# Patient Record
Sex: Female | Born: 1970 | Race: White | Hispanic: No | Marital: Single | Smoking: Never smoker
Health system: Southern US, Community
[De-identification: ages and names within clinical notes are randomized; demographics above are authoritative.]

## PROBLEM LIST (undated history)

## (undated) DIAGNOSIS — I10 Essential (primary) hypertension: Secondary | ICD-10-CM

## (undated) HISTORY — PX: HERNIA REPAIR: SHX51

## (undated) HISTORY — PX: CERVICAL SPINE SURGERY: SHX589

## (undated) HISTORY — PX: ABDOMINAL HYSTERECTOMY: SHX81

---

## 2010-05-07 ENCOUNTER — Ambulatory Visit: Payer: Self-pay | Admitting: Family Medicine

## 2010-05-17 ENCOUNTER — Ambulatory Visit: Payer: Self-pay | Admitting: Family Medicine

## 2015-03-27 ENCOUNTER — Encounter: Payer: Self-pay | Admitting: Emergency Medicine

## 2015-03-27 ENCOUNTER — Ambulatory Visit
Admission: EM | Admit: 2015-03-27 | Discharge: 2015-03-27 | Disposition: A | Discharge status: Home / Self Care | Payer: BLUE CROSS/BLUE SHIELD | Attending: Family Medicine | Admitting: Family Medicine

## 2015-03-27 DIAGNOSIS — M62838 Other muscle spasm: Secondary | ICD-10-CM

## 2015-03-27 DIAGNOSIS — J302 Other seasonal allergic rhinitis: Secondary | ICD-10-CM

## 2015-03-27 DIAGNOSIS — M6249 Contracture of muscle, multiple sites: Secondary | ICD-10-CM | POA: Diagnosis not present

## 2015-03-27 DIAGNOSIS — J0100 Acute maxillary sinusitis, unspecified: Secondary | ICD-10-CM | POA: Diagnosis not present

## 2015-03-27 HISTORY — DX: Essential (primary) hypertension: I10

## 2015-03-27 MED ORDER — AMOXICILLIN-POT CLAVULANATE 875-125 MG PO TABS: 1.0000 | ORAL_TABLET | Freq: Two times a day (BID) | ORAL | Status: DC

## 2015-03-27 MED ORDER — METAXALONE 800 MG PO TABS: 800.0000 mg | ORAL_TABLET | Freq: Three times a day (TID) | ORAL | Status: DC

## 2015-03-27 NOTE — ED Provider Notes (Signed)
CSN: [id number]     Arrival date & time 03/27/15  1506 History   First MD Initiated Contact with Patient 03/27/15 1619     Chief Complaint  Patient presents with  . Facial Pain  . Headache   (Consider location/radiation/quality/duration/timing/severity/associated sxs/prior Treatment) HPI   This a 44 year old female who has recently moved into our area from [place] presents now with headaches and neck aches and sinus pain which she feels mostly over her maxillary sinuses. In addition, she is complaining of some neck aching with any motion; relates that she has been using a computer not on her desk with her extreme neck flexion may be the cause. She also is not using her pillow that she normally does and that she is now temporarily here -not on her normal bed. Denies any fever or chills. She has an appointment with Dr. Gavin Potters on Tuesday but came here because she was just miserable with her sinuses and her neck. Neck motion will occasionally give her a momentary stabbing pains in different parts of her head that do not persist.         Past Medical History  Diagnosis Date  . Hypertension    Past Surgical History  Procedure Laterality Date  . Hernia repair    . Abdominal hysterectomy     History reviewed. No pertinent family history. Social History  Substance Use Topics  . Smoking status: Never Smoker   . Smokeless tobacco: None  . Alcohol Use: Yes   OB History    No data available     Review of Systems  Constitutional: Negative for fever, chills and fatigue.  HENT: Positive for postnasal drip, sinus pressure and sore throat.   Musculoskeletal: Positive for myalgias, arthralgias and neck pain.  Allergic/Immunologic: Positive for environmental allergies.  All other systems reviewed and are negative.   Allergies  Erythromycin and Tape  Home Medications   Prior to Admission medications   Medication Sig Start Date End Date Taking? Authorizing Provider   hydrochlorothiazide (MICROZIDE) 12.5 MG capsule Take 12.5 mg by mouth daily.   Yes Historical Provider, MD  meloxicam (MOBIC) 15 MG tablet Take 15 mg by mouth daily.   Yes Historical Provider, MD  amoxicillin-clavulanate (AUGMENTIN) 875-125 MG per tablet Take 1 tablet by mouth every 12 (twelve) hours. 03/27/15   Lutricia Feil, PA-C  metaxalone (SKELAXIN) 800 MG tablet Take 1 tablet (800 mg total) by mouth 3 (three) times daily. 03/27/15   Lutricia Feil, PA-C   Meds Ordered and Administered this Visit  Medications - No data to display  BP 155/90 mmHg  Pulse 78  Temp(Src) 97 F (36.1 C) (Tympanic)  Resp 16  Ht  (1.626 m)  Wt 118 lb (53.524 kg)  BMI 20.24 kg/m2  SpO2 99% No data found.   Physical Exam  Constitutional: She is oriented to person, place, and time. She appears well-developed and well-nourished.  HENT:  Head: Normocephalic and atraumatic.  Right Ear: External ear normal.  Left Ear: External ear normal.  Tenderness percussion over the sinuses both the maxillary and frontal with the maxillary be more tender. Oropharynx is benign  Neck: No thyromegaly present.  Examination cervical spine shows tenderness to palpation of the posterior cervical muscles. Range of motion is slightly decreased with the discomfort at the extreme of all motion.  Pulmonary/Chest: Effort normal and breath sounds normal. No respiratory distress. She has no wheezes. She has no rales.  Musculoskeletal: Normal range of motion.  Lymphadenopathy:  She has no cervical adenopathy.  Neurological: She is alert and oriented to person, place, and time.  Skin: Skin is warm and dry.  Psychiatric: Her behavior is normal. Judgment and thought content normal.  Nursing note and vitals reviewed.   ED Course  Procedures (including critical care time)  Labs Review Labs Reviewed - No data to display  Imaging Review No results found.   Visual Acuity Review  Right Eye Distance:   Left Eye  Distance:   Bilateral Distance:    Right Eye Near:   Left Eye Near:    Bilateral Near:         MDM   1. Acute maxillary sinusitis, recurrence not specified   2. Seasonal allergies   3. Cervical paraspinal muscle spasm    Discharge Medication List as of 03/27/2015  4:49 PM    START taking these medications   Details  amoxicillin-clavulanate (AUGMENTIN) 875-125 MG per tablet Take 1 tablet by mouth every 12 (twelve) hours., Starting 03/27/2015, Until Discontinued, Print    metaxalone (SKELAXIN) 800 MG tablet Take 1 tablet (800 mg total) by mouth 3 (three) times daily., Starting 03/27/2015, Until Discontinued, Print      Plan: 1. Diagnosis reviewed with patient 2. rx as per orders; risks, benefits, potential side effects reviewed with patient 3. Recommend supportive treatment with computer ergonomics,pillow change.Flonaze OTC during Fall season 4. F/u prn if symptoms worsen or don't improve     Lutricia Feil, PA-C 03/27/15 1700

## 2015-03-27 NOTE — Discharge Instructions (Signed)

## 2015-03-27 NOTE — ED Notes (Signed)
Patient c/o HAs, sinus pain and pressure for a week.  Patient denies fevers.

## 2016-04-12 ENCOUNTER — Other Ambulatory Visit: Payer: Self-pay | Admitting: Nurse Practitioner

## 2016-04-12 DIAGNOSIS — Z1231 Encounter for screening mammogram for malignant neoplasm of breast: Secondary | ICD-10-CM

## 2016-06-26 ENCOUNTER — Ambulatory Visit
Admission: EM | Admit: 2016-06-26 | Discharge: 2016-06-26 | Disposition: A | Discharge status: Home / Self Care | Payer: BLUE CROSS/BLUE SHIELD | Attending: Family Medicine | Admitting: Family Medicine

## 2016-06-26 DIAGNOSIS — N3001 Acute cystitis with hematuria: Secondary | ICD-10-CM

## 2016-06-26 LAB — URINALYSIS, COMPLETE (UACMP) WITH MICROSCOPIC

## 2016-06-26 MED ORDER — CIPROFLOXACIN HCL 500 MG PO TABS: 500.0000 mg | ORAL_TABLET | Freq: Two times a day (BID) | ORAL | 0 refills | Status: DC

## 2016-06-26 NOTE — ED Triage Notes (Signed)
Pt c/o urinary frequency, urgency, and bladder spasms.

## 2016-06-26 NOTE — Discharge Instructions (Signed)
Take the antibiotic twice daily as prescribed.  Please follow-up if he worsen or fail to improve.  Take care  Dr.Kortlynn Poust

## 2016-06-26 NOTE — ED Provider Notes (Signed)
[facility]    CSN: [id number] Arrival date & time: 06/26/16  [id number]  History   Chief Complaint Chief Complaint  Patient presents with  . Urinary Tract Infection   HPI  45 year old female presents with concerns for UTI.  Patient states that she's had urinary frequency and dysuria since Tuesday. She's had associated "bladder spasms". She reports associated low back pain. No flank pain. No fever or chills. She's taking Uristat for her symptoms. No known exacerbating factors. She feels like this may be associated with intercourse. No other complaints or concerns at this time.  Past Medical History:  Diagnosis Date  . Hypertension    Past Surgical History:  Procedure Laterality Date  . ABDOMINAL HYSTERECTOMY    . HERNIA REPAIR     OB History    No data available     Home Medications    Prior to Admission medications   Medication Sig Start Date End Date Taking? Authorizing Provider  hydrochlorothiazide (MICROZIDE) 12.5 MG capsule Take 12.5 mg by mouth daily.   Yes Historical Provider, MD  meloxicam (MOBIC) 15 MG tablet Take 15 mg by mouth daily.   Yes Historical Provider, MD  ciprofloxacin (CIPRO) 500 MG tablet Take 1 tablet (500 mg total) by mouth every 12 (twelve) hours. 06/26/16   Tommie SamsJayce G Samuell Knoble, DO  metaxalone (SKELAXIN) 800 MG tablet Take 1 tablet (800 mg total) by mouth 3 (three) times daily. 03/27/15   Lutricia FeilWilliam P Roemer, PA-C   Family History History reviewed. No pertinent family history.  Social History Social History  Substance Use Topics  . Smoking status: Never Smoker  . Smokeless tobacco: Never Used  . Alcohol use Yes   Allergies   Erythromycin and Tape  Review of Systems Review of Systems  Constitutional: Negative.   Genitourinary: Positive for dysuria and frequency.  Musculoskeletal: Positive for back pain.   Physical Exam Triage Vital Signs ED Triage Vitals  Enc Vitals Group     BP 06/26/16 1039 (!) 173/101     Pulse Rate 06/26/16 1039  93     Resp 06/26/16 1039 18     Temp 06/26/16 1039 99.9 F (37.7 C)     Temp Source 06/26/16 1039 Oral     SpO2 06/26/16 1039 98 %     Weight 06/26/16 1041 112 lb (50.8 kg)     Height 06/26/16 1041 5\' 4"  (1.626 m)     Head Circumference --      Peak Flow --      Pain Score 06/26/16 1041 9     Pain Loc --      Pain Edu? --      Excl. in GC? --    Updated Vital Signs BP (!) 173/101 (BP Location: Right Arm)   Pulse 93   Temp 99.9 F (37.7 C) (Oral)   Resp 18   Ht 5\' 4"  (1.626 m)   Wt 112 lb (50.8 kg)   SpO2 98%   BMI 19.22 kg/m   Physical Exam  Constitutional: She is oriented to person, place, and time. She appears well-developed. No distress.  Cardiovascular: Normal rate and regular rhythm.   Pulmonary/Chest: Effort normal and breath sounds normal.  Abdominal: Soft.  Tender to palpation in the suprapubic region. No CVA tenderness.  Neurological: She is alert and oriented to person, place, and time.  Psychiatric:  Flat affect.   UC Treatments / Results  Labs (all labs ordered are listed, but only abnormal results are displayed) Labs Reviewed  URINALYSIS, COMPLETE (UACMP) WITH MICROSCOPIC - Abnormal; Notable for the following:       Result Value   Color, Urine ORANGE (*)    APPearance CLOUDY (*)    Glucose, UA   (*)    Value: TEST NOT REPORTED DUE TO COLOR INTERFERENCE OF URINE PIGMENT   Hgb urine dipstick   (*)    Value: TEST NOT REPORTED DUE TO COLOR INTERFERENCE OF URINE PIGMENT   Bilirubin Urine   (*)    Value: TEST NOT REPORTED DUE TO COLOR INTERFERENCE OF URINE PIGMENT   Ketones, ur   (*)    Value: TEST NOT REPORTED DUE TO COLOR INTERFERENCE OF URINE PIGMENT   Protein, ur   (*)    Value: TEST NOT REPORTED DUE TO COLOR INTERFERENCE OF URINE PIGMENT   Nitrite   (*)    Value: TEST NOT REPORTED DUE TO COLOR INTERFERENCE OF URINE PIGMENT   Leukocytes, UA   (*)    Value: TEST NOT REPORTED DUE TO COLOR INTERFERENCE OF URINE PIGMENT   Squamous Epithelial / LPF  0-5 (*)    Bacteria, UA MANY (*)    All other components within normal limits  URINE CULTURE    EKG  EKG Interpretation None      Radiology No results found.  Procedures Procedures (including critical care time)  Medications Ordered in UC Medications - No data to display   Initial Impression / Assessment and Plan / UC Course  I have reviewed the triage vital signs and the nursing notes.  Pertinent labs & imaging results that were available during my care of the patient were reviewed by me and considered in my medical decision making (see chart for details).  Clinical Course   45 year old female presents with symptoms of UTI. Patient has classic symptoms. Bacteria on UA. Remainder of UA unreadable secondary to OTC medication. Treating empirically with Cipro while awaiting culture.  Final Clinical Impressions(s) / UC Diagnoses   Final diagnoses:  Acute cystitis with hematuria    New Prescriptions New Prescriptions   CIPROFLOXACIN (CIPRO) 500 MG TABLET    Take 1 tablet (500 mg total) by mouth every 12 (twelve) hours.     Tommie SamsJayce G Santo Zahradnik, DO 06/26/16 1132

## 2016-06-29 ENCOUNTER — Telehealth: Payer: Self-pay | Admitting: Emergency Medicine

## 2016-06-29 LAB — URINE CULTURE: Culture: >=100000 — AB

## 2016-06-29 MED ORDER — NITROFURANTOIN MONOHYD MACRO 100 MG PO CAPS: 100.0000 mg | ORAL_CAPSULE | Freq: Two times a day (BID) | ORAL | 0 refills | Status: AC

## 2016-06-29 NOTE — Telephone Encounter (Signed)
Patient notified of urine culture result. Patient states that her urinary symptoms have not improved. Result show resistance to the Cipro which she is currently on.  Dr. Chaney MallingMortenson reviewed patient's results and would like Macrobid 100mg  1 tablet twice a day for 5 days be sent to patient's pharmacy.  Patient was instructed to stop the Cipro and that a prescription for Macrobid was sent to the [facility] in [place].  Patient instructed to take the new antibiotic as directed and to follow-up here or with PCP if symptoms do not improve or worsen.  Patient verbalized understanding.

## 2016-07-19 ENCOUNTER — Encounter: Payer: Self-pay | Admitting: Emergency Medicine

## 2016-07-19 ENCOUNTER — Ambulatory Visit
Admission: EM | Admit: 2016-07-19 | Discharge: 2016-07-19 | Disposition: A | Discharge status: Home / Self Care | Payer: BLUE CROSS/BLUE SHIELD | Attending: Family Medicine | Admitting: Family Medicine

## 2016-07-19 DIAGNOSIS — N390 Urinary tract infection, site not specified: Secondary | ICD-10-CM | POA: Diagnosis not present

## 2016-07-19 LAB — URINALYSIS, COMPLETE (UACMP) WITH MICROSCOPIC
Bilirubin Urine: NEGATIVE
GLUCOSE, UA: 100 mg/dL — AB
Ketones, ur: NEGATIVE mg/dL
NITRITE: POSITIVE — AB
PROTEIN: 30 mg/dL — AB
SPECIFIC GRAVITY, URINE: 1.015 (ref 1.005–1.030)
pH: 7 (ref 5.0–8.0)

## 2016-07-19 LAB — GLUCOSE, CAPILLARY
GLUCOSE-CAPILLARY: 115 mg/dL — AB (ref 65–99)
Glucose-Capillary: 115 mg/dL — ABNORMAL HIGH (ref 65–99)

## 2016-07-19 MED ORDER — CEPHALEXIN 500 MG PO CAPS: 500.0000 mg | ORAL_CAPSULE | Freq: Two times a day (BID) | ORAL | 0 refills | Status: AC

## 2016-07-19 NOTE — ED Provider Notes (Signed)
[facility] ____________________________________________  Time seen: Approximately 10:31 AM  I have reviewed the triage vital signs and the nursing notes.   HISTORY  Chief Complaint Dysuria   HPI Anita Pratt is a 46 y.o. female presenting for the complaints of urinary frequency, urinary urgency and burning with urination for the last 3 days. Patient reports has a history of similar occurring with urinary tract infection. Reports last urinary tract infection was 3 weeks ago, treated with oral Macrobid. Patient reports she was initially treated with Cipro however her urine showed resistance to Cipro. Patient reports she previously had had last urinary tract infection approximately 1.5 years ago, but reports she is now sexually active and urinary tract infections are starting to become more frequent. Patient reports that she does void post sexual intercourse. Denies any other known triggers. Reports continues to eat and drink well. Denies fevers, abdominal pain, back pain, vaginal discharge, vaginal complaints, rash, other recent antibiotic use. Denies other complaints.  No LMP recorded. Patient has had a hysterectomy.   Past Medical History:  Diagnosis Date  . Hypertension     There are no active problems to display for this patient.   Past Surgical History:  Procedure Laterality Date  . ABDOMINAL HYSTERECTOMY    . HERNIA REPAIR     No current facility-administered medications for this encounter.   Current Outpatient Prescriptions:  .  cephALEXin (KEFLEX) 500 MG capsule, Take 1 capsule (500 mg total) by mouth 2 (two) times daily., Disp: 14 capsule, Rfl: 0 .  hydrochlorothiazide (MICROZIDE) 12.5 MG capsule, Take 12.5 mg by mouth daily., Disp: , Rfl:  .  meloxicam (MOBIC) 15 MG tablet, Take 15 mg by mouth daily., Disp: , Rfl:   Allergies Erythromycin and Tape  History reviewed. No pertinent family history.  Social History Social History  Substance Use  Topics  . Smoking status: Never Smoker  . Smokeless tobacco: Never Used  . Alcohol use Yes    Review of Systems Constitutional: No fever/chills Eyes: No visual changes. ENT: No sore throat. Cardiovascular: Denies chest pain. Respiratory: Denies shortness of breath. Gastrointestinal: No abdominal pain.  No nausea, no vomiting.  No diarrhea.  No constipation. Genitourinary: Positive for dysuria. Musculoskeletal: Negative for back pain. Skin: Negative for rash. Neurological: Negative for headaches, focal weakness or numbness.  10-point ROS otherwise negative.  ____________________________________________   PHYSICAL EXAM:  VITAL SIGNS: ED Triage Vitals  Enc Vitals Group     BP 07/19/16 0958 (!) 165/97     Pulse Rate 07/19/16 0958 82     Resp 07/19/16 0958 16     Temp 07/19/16 0958 97.8 F (36.6 C)     Temp Source 07/19/16 0958 Oral     SpO2 07/19/16 0958 99 %     Weight 07/19/16 0958 112 lb (50.8 kg)     Height 07/19/16 0958 5\' 4"  (1.626 m)     Head Circumference --      Peak Flow --      Pain Score 07/19/16 1000 3     Pain Loc --      Pain Edu? --      Excl. in GC? --     Constitutional: Alert and oriented. Well appearing and in no acute distress. Eyes: Conjunctivae are normal. PERRL. EOMI. ENT      Head: Normocephalic and atraumatic.      Mouth/Throat: Mucous membranes are moist.Oropharynx non-erythematous. Cardiovascular: Normal rate, regular rhythm. Grossly normal heart sounds.  Good peripheral circulation. Respiratory:  Normal respiratory effort without tachypnea nor retractions. Breath sounds are clear and equal bilaterally. No wheezes/rales/rhonchi.. Gastrointestinal: Soft and nontender. No distention. No CVA tenderness. Musculoskeletal: Ambulatory with steady gait. No midline cervical, thoracic or lumbar tenderness to palpation.  Skin:  Skin is warm, dry and intact. No rash noted. Psychiatric: Mood and affect are normal. Speech and behavior are normal.  Patient exhibits appropriate insight and judgment   ___________________________________________   LABS (all labs ordered are listed, but only abnormal results are displayed)  Labs Reviewed  URINALYSIS, COMPLETE (UACMP) WITH MICROSCOPIC - Abnormal; Notable for the following:       Result Value   Color, Urine AMBER (*)    APPearance CLOUDY (*)    Glucose, UA 100 (*)    Hgb urine dipstick SMALL (*)    Protein, ur 30 (*)    Nitrite POSITIVE (*)    Leukocytes, UA MODERATE (*)    Squamous Epithelial / LPF 0-5 (*)    Bacteria, UA MANY (*)    All other components within normal limits  GLUCOSE, CAPILLARY - Abnormal; Notable for the following:    Glucose-Capillary 115 (*)    All other components within normal limits  GLUCOSE, CAPILLARY - Abnormal; Notable for the following:    Glucose-Capillary 115 (*)    All other components within normal limits  URINE CULTURE  CBG MONITORING, ED    PROCEDURES Procedures    INITIAL IMPRESSION / ASSESSMENT AND PLAN / ED COURSE  Pertinent labs & imaging results that were available during my care of the patient were reviewed by me and considered in my medical decision making (see chart for details).  Well-appearing patient. No acute distress. Dysuria for the last 3 days. Urinalysis reviewed. Suggestive of urinary tract infection. 100 glucose noted in urine, fingerstick glucose 115. Discussed these results in detail with patient. Will culture urinalysis. Patient last urine culture showing resistance to Cipro, ampicillin, Bactrim. Discussed in detail with patient as she was most recently treated with Macrobid, will treat patient with oral cephalexin. Will reculture urine. Encouraged patient to have follow-up. Encourage rest, fluids, supportive care, void post sexual intercourse. Information given for urology.Discussed indication, risks and benefits of medications with patient.  Discussed follow up with Primary care physician this week. Discussed follow up  and return parameters including no resolution or any worsening concerns. Patient verbalized understanding and agreed to plan.   ____________________________________________   FINAL CLINICAL IMPRESSION(S) / ED DIAGNOSES  Final diagnoses:  Urinary tract infection without hematuria, site unspecified     Discharge Medication List as of 07/19/2016 10:58 AM    START taking these medications   Details  cephALEXin (KEFLEX) 500 MG capsule Take 1 capsule (500 mg total) by mouth 2 (two) times daily., Starting Tue 07/19/2016, Until Tue 07/26/2016, Normal        Note: This dictation was prepared with Dragon dictation along with smaller phrase technology. Any transcriptional errors that result from this process are unintentional.    Clinical Course       Renford Dills, NP 07/19/16 1147

## 2016-07-19 NOTE — Discharge Instructions (Signed)
Take medication as prescribed. Rest. Drink plenty of fluids.  ° °Follow up with your primary care physician this week as needed. Return to Urgent care for new or worsening concerns.  ° °

## 2016-07-19 NOTE — ED Triage Notes (Signed)
Patient c/o urinary frequency and burning when urinating for the past few days.

## 2016-07-21 LAB — URINE CULTURE

## 2016-09-02 ENCOUNTER — Other Ambulatory Visit: Payer: Self-pay | Admitting: Specialist

## 2016-09-02 DIAGNOSIS — N83292 Other ovarian cyst, left side: Secondary | ICD-10-CM

## 2016-09-02 DIAGNOSIS — R102 Pelvic and perineal pain: Secondary | ICD-10-CM

## 2017-03-18 ENCOUNTER — Ambulatory Visit
Admission: EM | Admit: 2017-03-18 | Discharge: 2017-03-18 | Disposition: A | Discharge status: Home / Self Care | Payer: BLUE CROSS/BLUE SHIELD | Attending: Family Medicine | Admitting: Family Medicine

## 2017-03-18 DIAGNOSIS — J0100 Acute maxillary sinusitis, unspecified: Secondary | ICD-10-CM

## 2017-03-18 DIAGNOSIS — J029 Acute pharyngitis, unspecified: Secondary | ICD-10-CM

## 2017-03-18 LAB — RAPID STREP SCREEN (MED CTR MEBANE ONLY): Streptococcus, Group A Screen (Direct): NEGATIVE

## 2017-03-18 MED ORDER — AZITHROMYCIN 250 MG PO TABS: 250.0000 mg | ORAL_TABLET | Freq: Every day | ORAL | 0 refills | Status: DC

## 2017-03-18 NOTE — ED Provider Notes (Signed)
[facility]    CSN: [id number] Arrival date & time: 03/18/17  0800     History   Chief Complaint Chief Complaint  Patient presents with  . Sore Throat    HPI Anita Pratt is a 46 y.o. female.   Patient presents with complaint of sinus drainage and sore throat that has gone for 3-4 days. Patient states that she woke up about 1 AM this morning and felt like her throat was starting to close up and that she was having some trouble breathing. She reports that the swelling started yesterday but is worse this morning. Patient also reports chills and sweating but is unsure if she had a fever. She does report some shortness of breath. Patient also reports some stomach pain/discomfort from her drainage. She states that she's been hungry but can't eat. She is taking Benadryl at night but no other over-the-counter medications. She did state she had a trip to [place] about a week or so ago. Patient states that her dog got sick after that trip they both went to the [facility], stating the dog had a bacterial "ear and stomach infection". Patient denies any chest pain      Past Medical History:  Diagnosis Date  . Hypertension     There are no active problems to display for this patient.   Past Surgical History:  Procedure Laterality Date  . ABDOMINAL HYSTERECTOMY    . HERNIA REPAIR      OB History    No data available       Home Medications    Prior to Admission medications   Medication Sig Start Date End Date Taking? Authorizing Provider  amLODipine (NORVASC) 10 MG tablet Take 10 mg by mouth daily.   Yes [provider]  estradiol cypionate (DEPO-ESTRADIOL) 5 MG/ML injection Inject into the muscle every 28 (twenty-eight) days.   Yes [provider]  azithromycin (ZITHROMAX Z-PAK) 250 MG tablet Take 1 tablet (250 mg total) by mouth daily. Take 2 tablets by mouth the first day followed by one tablet daily for next 4 days. 03/18/17   Anita Schatz,  PA-C  hydrochlorothiazide (MICROZIDE) 12.5 MG capsule Take 12.5 mg by mouth daily.    [provider]  meloxicam (MOBIC) 15 MG tablet Take 15 mg by mouth daily.    [provider]    Family History History reviewed. No pertinent family history.  Social History Social History  Substance Use Topics  . Smoking status: Never Smoker  . Smokeless tobacco: Never Used  . Alcohol use Yes     Comment: social     Allergies   Erythromycin; Lisinopril; and Tape   Review of Systems Review of Systems As noted above in history of present illness. Other systems reviewed and found to be negative  Physical Exam Triage Vital Signs ED Triage Vitals  Enc Vitals Group     BP 03/18/17 0819 (!) 155/98     Pulse Rate 03/18/17 0819 (!) 110     Resp 03/18/17 0819 18     Temp 03/18/17 0819 98.1 F (36.7 C)     Temp Source 03/18/17 0819 Oral     SpO2 03/18/17 0819 100 %     Weight 03/18/17 0815 116 lb (52.6 kg)     Height 03/18/17 0815  (1.626 m)     Head Circumference --      Peak Flow --      Pain Score 03/18/17 0816 7  Pain Loc --      Pain Edu? --      Excl. in GC? --    No data found.   Updated Vital Signs BP (!) 155/98 (BP Location: Left Arm)   Pulse (!) 110   Temp 98.1 F (36.7 C) (Oral)   Resp 18   Ht 5\' 4"  (1.626 m)   Wt 116 lb (52.6 kg)   SpO2 100%   BMI 19.91 kg/m    Physical Exam  Constitutional: She is oriented to person, place, and time. She appears well-developed and well-nourished. No distress.  HENT:  Head: Normocephalic and atraumatic.  Right Ear: External ear normal.  Left Ear: External ear normal.  Mouth/Throat: Uvula is midline and mucous membranes are normal. Posterior oropharyngeal erythema present. Tonsils are 1+ on the right. Tonsils are 1+ on the left.  Eyes: Pupils are equal, round, and reactive to light. EOM are normal.  Neck: Normal range of motion. Neck supple.  Cardiovascular: Normal rate, regular rhythm and normal  heart sounds.  Exam reveals no friction rub.   No murmur heard. Pulmonary/Chest: Effort normal and breath sounds normal. No respiratory distress. She has no wheezes.  Abdominal: Soft. Bowel sounds are normal.  Musculoskeletal: Normal range of motion.  Lymphadenopathy:    She has cervical adenopathy.  Neurological: She is alert and oriented to person, place, and time. No cranial nerve deficit.  Skin: Skin is warm and dry.     UC Treatments / Results  Labs (all labs ordered are listed, but only abnormal results are displayed) Labs Reviewed  RAPID STREP SCREEN (NOT AT [facility])  CULTURE, GROUP A STREP [facility])    EKG  EKG Interpretation None       Radiology No results found.  Procedures Procedures (including critical care time)  Medications Ordered in UC Medications - No data to display   Initial Impression / Assessment and Plan / UC Course  I have reviewed the triage vital signs and the nursing notes.  Pertinent labs & imaging results that were available during my care of the patient were reviewed by me and considered in my medical decision making (see chart for details).     Patient presents with 34 days of increasing sore throat with swelling, with swelling waking her up at 1 AM with trouble breathing. Patient also with some chills, questionable fever, shortness of breath, and stomach pain from her drainage. Patient exam with red swollen throat, tender lymphadenopathy, and maxillary sinus tenderness to palpation. Rapid strep was negative, will send for culture. Given exam, we'll go ahead and prescribe a course of azithromycin. Will advise patient to push fluids and given instructions regarding sinus rinse.  Final Clinical Impressions(s) / UC Diagnoses   Final diagnoses:  Sore throat  Acute maxillary sinusitis, recurrence not specified    New Prescriptions Discharge Medication List as of 03/18/2017  8:53 AM    START taking these medications   Details  azithromycin  (ZITHROMAX Z-PAK) 250 MG tablet Take 1 tablet (250 mg total) by mouth daily. Take 2 tablets by mouth the first day followed by one tablet daily for next 4 days., Starting Sat 03/18/2017, Normal        Controlled Substance Prescriptions [place] Controlled Substance Registry consulted? Not Applicable   Anita SchatzMichael D Genieve Ramaswamy, PA-C    Anita SchatzHarris, Reba Hulett D, PA-C 03/18/17 [id number]

## 2017-03-18 NOTE — ED Triage Notes (Signed)
Pt with 3 days of sore throat, nasal drainage, sneezing, and blowing blood from nose at times.  Pain 7/10. Unknown if fever but feels hot and cold.

## 2017-03-18 NOTE — Discharge Instructions (Signed)
-   Azithromycin: Take 2 tablets by mouth the first day followed by one tablet daily for next 4 days. - acetaminophen or ibuprofen for pain - sinus rinse using nasal saline spray or sinus rinse: see attached - fluids - can return to clinic or follow up with PCP should symptoms worsen or not improve

## 2017-03-21 LAB — CULTURE, GROUP A STREP (THRC)

## 2017-03-27 ENCOUNTER — Ambulatory Visit
Admission: EM | Admit: 2017-03-27 | Discharge: 2017-03-27 | Disposition: A | Discharge status: Home / Self Care | Payer: BLUE CROSS/BLUE SHIELD | Attending: Family Medicine | Admitting: Family Medicine

## 2017-03-27 DIAGNOSIS — J029 Acute pharyngitis, unspecified: Secondary | ICD-10-CM

## 2017-03-27 DIAGNOSIS — J069 Acute upper respiratory infection, unspecified: Secondary | ICD-10-CM | POA: Diagnosis not present

## 2017-03-27 MED ORDER — HYDROCOD POLST-CPM POLST ER 10-8 MG/5ML PO SUER: 5.0000 mL | Freq: Two times a day (BID) | ORAL | 0 refills | Status: DC

## 2017-03-27 MED ORDER — BENZONATATE 200 MG PO CAPS: ORAL_CAPSULE | ORAL | 0 refills | Status: DC

## 2017-03-27 NOTE — Discharge Instructions (Signed)
Use Tessalon Perles for daytime coughing. Use Tussionex for nighttime coughing and helping with sleep.Marland Kitchen Plenty of fluids and use Motrin or Tylenol for aches and pain. Use Flonase daily for the next 3-4 weeks for drainage.

## 2017-03-27 NOTE — ED Provider Notes (Signed)
[facility]    CSN: [id number] Arrival date & time: 03/27/17  0804     History   Chief Complaint Chief Complaint  Patient presents with  . Sore Throat    HPI Anita Pratt is a 46 y.o. female.   HPI  This a 46 year old female who returns to our clinic continuing to complain of sore throat congestion sinus pain and pressure. She states she was seen here on 9/8 given a Z-Pak. Her throat swab grew out non-a strep. She states that she was feeling better for the first 2 days but then started feeling poorly again. States she even went and saw her primary care physician on the 13th who told her that she should gargle with saltwater and finish up her azithromycin.  She is a nonsmoker. She is very busy with school and her other responsibilities and doesn't have time to be sick.        Past Medical History:  Diagnosis Date  . Hypertension     There are no active problems to display for this patient.   Past Surgical History:  Procedure Laterality Date  . ABDOMINAL HYSTERECTOMY    . HERNIA REPAIR      OB History    No data available       Home Medications    Prior to Admission medications   Medication Sig Start Date End Date Taking? Authorizing Provider  amLODipine (NORVASC) 10 MG tablet Take 10 mg by mouth daily.   Yes [provider]  estradiol cypionate (DEPO-ESTRADIOL) 5 MG/ML injection Inject into the muscle every 28 (twenty-eight) days.   Yes [provider]  hydrochlorothiazide (MICROZIDE) 12.5 MG capsule Take 12.5 mg by mouth daily.   Yes [provider]  meloxicam (MOBIC) 15 MG tablet Take 15 mg by mouth daily.   Yes [provider]  benzonatate (TESSALON) 200 MG capsule Take one cap TID PRN cough 03/27/17   Lutricia Feil, PA-C  chlorpheniramine-HYDROcodone [facility]) 10-8 MG/5ML SUER Take 5 mLs by mouth 2 (two) times daily. 03/27/17   Lutricia Feil, PA-C    Family History History  reviewed. No pertinent family history.  Social History Social History  Substance Use Topics  . Smoking status: Never Smoker  . Smokeless tobacco: Never Used  . Alcohol use Yes     Comment: social     Allergies   Erythromycin; Lisinopril; and Tape   Review of Systems Review of Systems  Constitutional: Positive for activity change. Negative for appetite change, chills, fatigue and fever.  HENT: Positive for congestion, postnasal drip, rhinorrhea, sinus pain, sinus pressure and sore throat.   Respiratory: Positive for cough.   All other systems reviewed and are negative.    Physical Exam Triage Vital Signs ED Triage Vitals  Enc Vitals Group     BP 03/27/17 0827 (!) 149/97     Pulse Rate 03/27/17 0827 (!) 110     Resp 03/27/17 0827 18     Temp 03/27/17 0827 98.2 F (36.8 C)     Temp Source 03/27/17 0827 Oral     SpO2 03/27/17 0827 100 %     Weight 03/27/17 0825 118 lb (53.5 kg)     Height 03/27/17 0825  (1.626 m)     Head Circumference --      Peak Flow --      Pain Score 03/27/17 0825 7     Pain Loc --      Pain Edu? --  Excl. in GC? --    No data found.   Updated Vital Signs BP (!) 149/97 (BP Location: Left Arm)   Pulse (!) 110   Temp 98.2 F (36.8 C) (Oral)   Resp 18   Ht  (1.626 m)   Wt 118 lb (53.5 kg)   SpO2 100%   BMI 20.25 kg/m   Visual Acuity Right Eye Distance:   Left Eye Distance:   Bilateral Distance:    Right Eye Near:   Left Eye Near:    Bilateral Near:     Physical Exam  Constitutional: She is oriented to person, place, and time. She appears well-developed and well-nourished. No distress.  HENT:  Head: Normocephalic.  Right Ear: External ear normal.  Left Ear: External ear normal.  Nose: Nose normal.  Mouth/Throat: Oropharynx is clear and moist. No oropharyngeal exudate.  Eyes: Pupils are equal, round, and reactive to light. Right eye exhibits no discharge. Left eye exhibits no discharge.  Neck: Normal range of  motion.  Pulmonary/Chest: Effort normal and breath sounds normal. She has no wheezes. She has no rales.  Musculoskeletal: Normal range of motion.  Lymphadenopathy:    She has no cervical adenopathy.  Neurological: She is alert and oriented to person, place, and time.  Skin: Skin is warm and dry. She is not diaphoretic.  Psychiatric: She has a normal mood and affect. Her behavior is normal. Judgment and thought content normal.  Nursing note and vitals reviewed.    UC Treatments / Results  Labs (all labs ordered are listed, but only abnormal results are displayed) Labs Reviewed - No data to display  EKG  EKG Interpretation None       Radiology No results found.  Procedures Procedures (including critical care time)  Medications Ordered in UC Medications - No data to display   Initial Impression / Assessment and Plan / UC Course  I have reviewed the triage vital signs and the nursing notes.  Pertinent labs & imaging results that were available during my care of the patient were reviewed by me and considered in my medical decision making (see chart for details).     Plan: 1. Test/x-ray results and diagnosis reviewed with patient 2. rx as per orders; risks, benefits, potential side effects reviewed with patient 3. Recommend supportive treatment with fluids, salt water gargles, lozenges,. Have had a discussion with the patient regarding additional antibiotics and that he most likely will not work if the Z-Pak didn't work and this is likely a viral illness even though she did test positive for non-group A strep.It would've likely been eradicated with the Zithromax. She possibly has a coexistentf viral illness that'll have to run its course. She is opted not to take any more antibiotics and instead will treat her symptomatically with cough preparations and Flonase for drainage. If she still is not improving she should follow-up with her primary care physician again 4. F/u prn if  symptoms worsen or don't improve   Final Clinical Impressions(s) / UC Diagnoses   Final diagnoses:  Sore throat  Upper respiratory tract infection, unspecified type    New Prescriptions Discharge Medication List as of 03/27/2017  8:57 AM    START taking these medications   Details  benzonatate (TESSALON) 200 MG capsule Take one cap TID PRN cough, Normal    chlorpheniramine-HYDROcodone (TUSSIONEX PENNKINETIC ER) 10-8 MG/5ML SUER Take 5 mLs by mouth 2 (two) times daily., Starting Mon 03/27/2017, Print  Controlled Substance Prescriptions [place] Controlled Substance Registry consulted? Not Applicable   Lutricia Feil, PA-C 03/27/17 9604

## 2017-03-27 NOTE — ED Triage Notes (Signed)
Patient complains of sore throat, congestion, sinus pain and pressure. Patient states that she was seen here on 09/08 and was given a Zpack. Patient states that she initially felt better but since worsened on Thursday. Patient states that they called her about her strep culture and she was positive.

## 2017-04-20 ENCOUNTER — Other Ambulatory Visit: Payer: Self-pay | Admitting: Orthopedic Surgery

## 2017-04-20 DIAGNOSIS — M542 Cervicalgia: Secondary | ICD-10-CM

## 2017-04-20 DIAGNOSIS — M25521 Pain in right elbow: Secondary | ICD-10-CM

## 2017-04-20 DIAGNOSIS — M5030 Other cervical disc degeneration, unspecified cervical region: Secondary | ICD-10-CM

## 2017-04-20 DIAGNOSIS — M5412 Radiculopathy, cervical region: Secondary | ICD-10-CM

## 2017-04-27 ENCOUNTER — Ambulatory Visit
Admission: RE | Admit: 2017-04-27 | Discharge: 2017-04-27 | Disposition: A | Discharge status: Home / Self Care | Payer: BLUE CROSS/BLUE SHIELD | Source: Ambulatory Visit | Attending: Orthopedic Surgery | Admitting: Orthopedic Surgery

## 2017-04-27 DIAGNOSIS — M5030 Other cervical disc degeneration, unspecified cervical region: Secondary | ICD-10-CM | POA: Diagnosis not present

## 2017-04-27 DIAGNOSIS — M542 Cervicalgia: Secondary | ICD-10-CM

## 2017-04-27 DIAGNOSIS — M5412 Radiculopathy, cervical region: Secondary | ICD-10-CM

## 2017-04-27 DIAGNOSIS — M50223 Other cervical disc displacement at C6-C7 level: Secondary | ICD-10-CM | POA: Insufficient documentation

## 2017-04-27 DIAGNOSIS — M25521 Pain in right elbow: Secondary | ICD-10-CM

## 2017-04-27 DIAGNOSIS — M4802 Spinal stenosis, cervical region: Secondary | ICD-10-CM | POA: Insufficient documentation

## 2018-07-27 ENCOUNTER — Ambulatory Visit
Admission: EM | Admit: 2018-07-27 | Discharge: 2018-07-27 | Disposition: A | Discharge status: Home / Self Care | Payer: BLUE CROSS/BLUE SHIELD | Attending: Family Medicine | Admitting: Family Medicine

## 2018-07-27 ENCOUNTER — Other Ambulatory Visit: Payer: Self-pay

## 2018-07-27 ENCOUNTER — Encounter: Payer: Self-pay | Admitting: Emergency Medicine

## 2018-07-27 DIAGNOSIS — R0981 Nasal congestion: Secondary | ICD-10-CM | POA: Diagnosis not present

## 2018-07-27 DIAGNOSIS — J029 Acute pharyngitis, unspecified: Secondary | ICD-10-CM

## 2018-07-27 DIAGNOSIS — J0100 Acute maxillary sinusitis, unspecified: Secondary | ICD-10-CM | POA: Insufficient documentation

## 2018-07-27 LAB — RAPID STREP SCREEN (MED CTR MEBANE ONLY): STREPTOCOCCUS, GROUP A SCREEN (DIRECT): NEGATIVE

## 2018-07-27 MED ORDER — FLUTICASONE PROPIONATE 50 MCG/ACT NA SUSP: 2.0000 | Freq: Every day | NASAL | 0 refills | Status: AC

## 2018-07-27 MED ORDER — AMOXICILLIN-POT CLAVULANATE 875-125 MG PO TABS: 1.0000 | ORAL_TABLET | Freq: Two times a day (BID) | ORAL | 0 refills | Status: DC

## 2018-07-27 NOTE — ED Provider Notes (Signed)
[facility]    CSN: [id number] Arrival date & time: 07/27/18  1108  History   Chief Complaint Chief Complaint  Patient presents with  . Sore Throat  . Sinus Problem   HPI   48 year old female presents with the above complaints.  Patient reports a 10-day history of sinus pressure/pain, congestion, sore throat.  She reports postnasal drip as well.  No documented fever.  Symptoms are severe and worsening.  She has been taking an over-the-counter antihistamine without resolution.  No known exacerbating factors.  No other associated symptoms.  No other complaints.  PMH, Surgical Hx, Family Hx, Social History reviewed and updated as below.  Past Medical History:  Diagnosis Date  . Hypertension    Past Surgical History:  Procedure Laterality Date  . ABDOMINAL HYSTERECTOMY    . HERNIA REPAIR     OB History   No obstetric history on file.    Home Medications    Prior to Admission medications   Medication Sig Start Date End Date Taking? Authorizing Provider  amLODipine (NORVASC) 10 MG tablet Take 10 mg by mouth daily.   Yes [provider]  hydrochlorothiazide (MICROZIDE) 12.5 MG capsule Take 12.5 mg by mouth daily.   Yes [provider]  meloxicam (MOBIC) 15 MG tablet Take 15 mg by mouth daily.   Yes [provider]  amoxicillin-clavulanate (AUGMENTIN) 875-125 MG tablet Take 1 tablet by mouth every 12 (twelve) hours. 07/27/18   Tommie Samsook, Shanin Szymanowski G, DO  fluticasone (FLONASE) 50 MCG/ACT nasal spray Place 2 sprays into both nostrils daily. 07/27/18   Tommie Samsook, Amous Crewe G, DO   Family History Family History  Problem Relation Age of Onset  . Healthy Mother   . Alzheimer's disease Father    Social History Social History   Tobacco Use  . Smoking status: Never Smoker  . Smokeless tobacco: Never Used  Substance Use Topics  . Alcohol use: Yes    Comment: social  . Drug use: No   Allergies   Erythromycin; Lisinopril; and Tape  Review of  Systems Review of Systems  Constitutional: Negative for fever.  HENT: Positive for congestion, postnasal drip, sinus pressure, sinus pain and sore throat.    Physical Exam Triage Vital Signs ED Triage Vitals  Enc Vitals Group     BP 07/27/18 1127 (!) 135/94     Pulse Rate 07/27/18 1127 (!) 110     Resp 07/27/18 1127 16     Temp 07/27/18 1127 98.6 F (37 C)     Temp Source 07/27/18 1127 Oral     SpO2 07/27/18 1127 99 %     Weight 07/27/18 1122 115 lb (52.2 kg)     Height 07/27/18 1122 5\' 4"  (1.626 m)     Head Circumference --      Peak Flow --      Pain Score 07/27/18 1122 7     Pain Loc --      Pain Edu? --      Excl. in GC? --    Updated Vital Signs BP (!) 135/94 (BP Location: Right Arm)   Pulse (!) 110   Temp 98.6 F (37 C) (Oral)   Resp 16   Ht 5\' 4"  (1.626 m)   Wt 52.2 kg   SpO2 99%   BMI 19.74 kg/m   Visual Acuity Right Eye Distance:   Left Eye Distance:   Bilateral Distance:    Right Eye Near:   Left Eye Near:    Bilateral  Near:     Physical Exam Vitals signs and nursing note reviewed.  Constitutional:      General: She is not in acute distress. HENT:     Head: Normocephalic and atraumatic.     Right Ear: Tympanic membrane normal.     Left Ear: Tympanic membrane normal.     Nose:     Right Sinus: Maxillary sinus tenderness present.     Left Sinus: Maxillary sinus tenderness present.     Mouth/Throat:     Pharynx: Posterior oropharyngeal erythema present.  Eyes:     General:        Right eye: No discharge.        Left eye: No discharge.     Conjunctiva/sclera: Conjunctivae normal.  Cardiovascular:     Rate and Rhythm: Regular rhythm. Tachycardia present.  Pulmonary:     Effort: Pulmonary effort is normal.     Breath sounds: No wheezing, rhonchi or rales.  Neurological:     Mental Status: She is alert.  Psychiatric:        Behavior: Behavior normal.     Comments: Flat affect.     UC Treatments / Results  Labs (all labs ordered are  listed, but only abnormal results are displayed) Labs Reviewed  RAPID STREP SCREEN ([facility] ONLY)  CULTURE, GROUP A STREP [facility])    EKG None  Radiology No results found.  Procedures Procedures (including critical care time)  Medications Ordered in UC Medications - No data to display  Initial Impression / Assessment and Plan / UC Course  I have reviewed the triage vital signs and the nursing notes.  Pertinent labs & imaging results that were available during my care of the patient were reviewed by me and considered in my medical decision making (see chart for details).    48 year old female presents with sinusitis. Treating with flonase and Augmentin.   Final Clinical Impressions(s) / UC Diagnoses   Final diagnoses:  Acute maxillary sinusitis, recurrence not specified   Discharge Instructions   None    ED Prescriptions    Medication Sig Dispense Auth. Provider   amoxicillin-clavulanate (AUGMENTIN) 875-125 MG tablet Take 1 tablet by mouth every 12 (twelve) hours. 14 tablet Teea Ducey G, DO   fluticasone (FLONASE) 50 MCG/ACT nasal spray Place 2 sprays into both nostrils daily. 16 g Tommie Sams, DO     Controlled Substance Prescriptions [place] Controlled Substance Registry consulted? Not Applicable   Tommie Sams, DO 07/27/18 1247

## 2018-07-27 NOTE — ED Triage Notes (Signed)
Patient c/o sinus congestion and pressure and sore throat for over a week.

## 2018-07-30 LAB — CULTURE, GROUP A STREP (THRC)

## 2018-11-20 ENCOUNTER — Ambulatory Visit
Admission: EM | Admit: 2018-11-20 | Discharge: 2018-11-20 | Disposition: A | Discharge status: Home / Self Care | Payer: BLUE CROSS/BLUE SHIELD | Attending: Family Medicine | Admitting: Family Medicine

## 2018-11-20 ENCOUNTER — Other Ambulatory Visit: Payer: Self-pay

## 2018-11-20 ENCOUNTER — Encounter: Payer: Self-pay | Admitting: Emergency Medicine

## 2018-11-20 DIAGNOSIS — S0502XA Injury of conjunctiva and corneal abrasion without foreign body, left eye, initial encounter: Secondary | ICD-10-CM

## 2018-11-20 DIAGNOSIS — H5713 Ocular pain, bilateral: Secondary | ICD-10-CM | POA: Diagnosis not present

## 2018-11-20 DIAGNOSIS — Z760 Encounter for issue of repeat prescription: Secondary | ICD-10-CM | POA: Diagnosis not present

## 2018-11-20 MED ORDER — KETOROLAC TROMETHAMINE 0.5 % OP SOLN: 1.0000 [drp] | Freq: Four times a day (QID) | OPHTHALMIC | 0 refills | Status: AC | PRN

## 2018-11-20 MED ORDER — HYDROCHLOROTHIAZIDE 12.5 MG PO CAPS: 12.5000 mg | ORAL_CAPSULE | Freq: Every day | ORAL | 0 refills | Status: DC

## 2018-11-20 MED ORDER — CIPROFLOXACIN HCL 0.3 % OP SOLN: 1.0000 [drp] | Freq: Four times a day (QID) | OPHTHALMIC | 0 refills | Status: AC

## 2018-11-20 NOTE — ED Triage Notes (Signed)
Patient c/o pain in her eyes that started last Thursday.  Patient states that she was rubbing her eyes and might have scratched her left eye.  Patient sates that her left eye seems worse.  Patient denies fevers.

## 2018-11-20 NOTE — ED Provider Notes (Signed)
[facility]    CSN: [id number] Arrival date & time: 11/20/18  0803  History   Chief Complaint Chief Complaint  Patient presents with  . Eye Problem   HPI  48 year old female presents with the above complaint.  Patient reports that on Thursday of last week she was experiencing dry eyes.  She subsequently rubbed her eyes and is now concerned that she may have a corneal abrasion.  She has pain in both eyes but left greater than right.  She states that she has ongoing issues with dry eyes particularly during allergy season.  No significant discharge.  No visual disturbance.  Her visual acuity is quite good today.  She has been using erythromycin ointment for the past 2 days.  Of note, her allergy list states that she is allergic to erythromycin.  She reports mild itching.  Pain currently 7/10 in severity.  No relieving factors.  No other associated symptoms.    Also, patient states that she is nearly out of her hydrochlorothiazide.  She is requesting medication refill today.  PMH, Surgical Hx, Family Hx, Social History reviewed and updated as below.  Past Medical History:  Diagnosis Date  . Hypertension   HLD Cervical spondylosis  Past Surgical History:  Procedure Laterality Date  . ABDOMINAL HYSTERECTOMY    . HERNIA REPAIR      OB History   No obstetric history on file.      Home Medications    Prior to Admission medications   Medication Sig Start Date End Date Taking? Authorizing Provider  amLODipine (NORVASC) 10 MG tablet Take 10 mg by mouth daily.   Yes [provider]  cyanocobalamin (,VITAMIN B-12,) 1000 MCG/ML injection INJECT 1 ML IN THE MUSCLE MONTHLY 07/30/18  Yes [provider]  meloxicam (MOBIC) 15 MG tablet Take 15 mg by mouth daily.   Yes [provider]  traMADol (ULTRAM) 50 MG tablet TK 1 T PO Q 8 H 11/15/18  Yes [provider]  ciprofloxacin (CILOXAN) 0.3 % ophthalmic solution Place 1 drop into both eyes every  6 (six) hours for 7 days. Administer 1 drop, every 2 hours, while awake, for 2 days. Then 1 drop, every 4 hours, while awake, for the next 5 days. 11/20/18 11/27/18  Tommie Samsook, Zaidee Rion G, DO  fluticasone (FLONASE) 50 MCG/ACT nasal spray Place 2 sprays into both nostrils daily. 07/27/18   Tommie Samsook, Adalynne Steffensmeier G, DO  hydrochlorothiazide (MICROZIDE) 12.5 MG capsule Take 1 capsule (12.5 mg total) by mouth daily. 11/20/18   Tommie Samsook, Lynita Groseclose G, DO  ketorolac (ACULAR) 0.5 % ophthalmic solution Place 1 drop into both eyes 4 (four) times daily as needed for up to 7 days (Pain). 11/20/18 11/27/18  Tommie Samsook, Idrissa Beville G, DO    Family History Family History  Problem Relation Age of Onset  . Healthy Mother   . Alzheimer's disease Father     Social History Social History   Tobacco Use  . Smoking status: Never Smoker  . Smokeless tobacco: Never Used  Substance Use Topics  . Alcohol use: Yes    Comment: social  . Drug use: No     Allergies   Erythromycin; Lisinopril; and Tape   Review of Systems Review of Systems  Constitutional: Negative.   Eyes: Positive for pain and itching.   Physical Exam Triage Vital Signs ED Triage Vitals  Enc Vitals Group     BP 11/20/18 0817 (!) 133/98     Pulse Rate 11/20/18 0817 (!) 115  Resp 11/20/18 0817 14     Temp 11/20/18 0817 99.3 F (37.4 C)     Temp Source 11/20/18 0817 Oral     SpO2 11/20/18 0817 99 %     Weight 11/20/18 0814 115 lb (52.2 kg)     Height 11/20/18 0814 5\' 4"  (1.626 m)     Head Circumference --      Peak Flow --      Pain Score 11/20/18 0814 7     Pain Loc --      Pain Edu? --      Excl. in GC? --    Updated Vital Signs BP (!) 133/98 (BP Location: Left Arm)   Pulse (!) 115   Temp 99.3 F (37.4 C) (Oral)   Resp 14   Ht 5\' 4"  (1.626 m)   Wt 52.2 kg   SpO2 99%   BMI 19.74 kg/m   Visual Acuity Right Eye Distance: 20/25 uncorrected Left Eye Distance: 20/25 uncorrected Bilateral Distance: 20/20 uncorrected  Right Eye Near:   Left Eye Near:     Bilateral Near:     Physical Exam Vitals signs and nursing note reviewed.  Constitutional:      General: She is not in acute distress.    Appearance: Normal appearance.  HENT:     Head: Normocephalic and atraumatic.     Nose: Nose normal.  Eyes:     General:        Right eye: No discharge.        Left eye: No discharge.     Conjunctiva/sclera: Conjunctivae normal.      Comments: Left eye -fluorescein exam revealed uptake at the labeled locations.  Pulmonary:     Effort: Pulmonary effort is normal. No respiratory distress.  Skin:    General: Skin is warm.     Findings: No rash.  Neurological:     Mental Status: She is alert.  Psychiatric:     Comments: Flat affect. Depressed mood.    UC Treatments / Results  Labs (all labs ordered are listed, but only abnormal results are displayed) Labs Reviewed - No data to display  EKG None  Radiology No results found.  Procedures Procedures (including critical care time)  Medications Ordered in UC Medications - No data to display  Initial Impression / Assessment and Plan / UC Course  I have reviewed the triage vital signs and the nursing notes.  Pertinent labs & imaging results that were available during my care of the patient were reviewed by me and considered in my medical decision making (see chart for details).    48 year old female presents with eye pain.  Patient appears to have taken fluorescein exam.  Placing on Cipro eyedrops as well as Toradol eyedrops.  Hydrochlorothiazide refilled today.  Advised to see ophthalmology if she fails to improve or worsens.  Final Clinical Impressions(s) / UC Diagnoses   Final diagnoses:  Abrasion of left cornea, initial encounter  Pain of both eyes  Medication refill     Discharge Instructions     Medications as prescribed.  If you fail to improve or worsen, please see Ophthalmology (call [facility]).  Take care  Dr. Adriana Simas    ED Prescriptions    Medication Sig  Dispense Auth. Provider   ciprofloxacin (CILOXAN) 0.3 % ophthalmic solution Place 1 drop into both eyes every 6 (six) hours for 7 days. Administer 1 drop, every 2 hours, while awake, for 2 days. Then 1 drop, every 4 hours,  while awake, for the next 5 days. 5 mL Mylea Roarty G, DO   ketorolac (ACULAR) 0.5 % ophthalmic solution Place 1 drop into both eyes 4 (four) times daily as needed for up to 7 days (Pain). 5 mL Mikolaj Woolstenhulme G, DO   hydrochlorothiazide (MICROZIDE) 12.5 MG capsule Take 1 capsule (12.5 mg total) by mouth daily. 90 capsule Tommie Sams, DO     Controlled Substance Prescriptions [place] Controlled Substance Registry consulted? Not Applicable   Tommie Sams, [place] 11/20/18 [phone]

## 2018-11-20 NOTE — Discharge Instructions (Signed)
Medications as prescribed.  If you fail to improve or worsen, please see Ophthalmology (call [facility]).  Take care  Dr. Adriana Simas

## 2019-03-07 ENCOUNTER — Telehealth: Payer: Self-pay | Admitting: Urgent Care

## 2019-03-07 ENCOUNTER — Other Ambulatory Visit: Payer: Self-pay | Admitting: Family Medicine

## 2019-03-07 MED ORDER — HYDROCHLOROTHIAZIDE 12.5 MG PO TABS: 12.5000 mg | ORAL_TABLET | Freq: Every day | ORAL | 0 refills | Status: DC

## 2019-03-07 NOTE — Telephone Encounter (Signed)
Patient called requesting refill of HCTZ 12.5mg . Stated Dr. Lacinda Axon sent this in for her in May. She has tried to establish care with a PCP but has been unsuccessful due to COVID right now. Spoke with Honor Loh, NP and script sent in for patient to [facility].

## 2019-04-25 ENCOUNTER — Other Ambulatory Visit: Payer: Self-pay

## 2019-04-25 DIAGNOSIS — Z20822 Contact with and (suspected) exposure to covid-19: Secondary | ICD-10-CM

## 2019-04-27 LAB — NOVEL CORONAVIRUS, NAA: SARS-CoV-2, NAA: NOT DETECTED

## 2019-06-13 ENCOUNTER — Ambulatory Visit (INDEPENDENT_AMBULATORY_CARE_PROVIDER_SITE_OTHER)
Admission: RE | Admit: 2019-06-13 | Discharge: 2019-06-13 | Disposition: A | Discharge status: Home / Self Care | Payer: BLUE CROSS/BLUE SHIELD | Source: Ambulatory Visit

## 2019-06-13 ENCOUNTER — Inpatient Hospital Stay: Admission: RE | Admit: 2019-06-13 | Payer: BLUE CROSS/BLUE SHIELD | Source: Ambulatory Visit

## 2019-06-13 ENCOUNTER — Other Ambulatory Visit: Payer: Self-pay

## 2019-06-13 DIAGNOSIS — I10 Essential (primary) hypertension: Secondary | ICD-10-CM | POA: Diagnosis not present

## 2019-06-13 DIAGNOSIS — Z760 Encounter for issue of repeat prescription: Secondary | ICD-10-CM

## 2019-06-13 DIAGNOSIS — E538 Deficiency of other specified B group vitamins: Secondary | ICD-10-CM

## 2019-06-13 MED ORDER — HYDROCHLOROTHIAZIDE 12.5 MG PO CAPS: 12.5000 mg | ORAL_CAPSULE | Freq: Every day | ORAL | 0 refills | Status: AC

## 2019-06-13 MED ORDER — CYANOCOBALAMIN 1000 MCG/ML IJ SOLN: INTRAMUSCULAR | 1 refills | Status: AC

## 2019-06-13 NOTE — ED Provider Notes (Signed)
[place]    Virtual Visit via Video Note:  Anita Pratt  initiated request for Telemedicine visit with [facility] team. I connected with Anita Pratt  on 06/13/2019 at 2:24 PM  for a synchronized telemedicine visit using a telephone enabled HIPPA compliant telemedicine application. I verified that I am speaking with Anita Pratt  using two identifiers. Anita Box, PA-C  was physically located in a [facility] Urgent care site and Anita Pratt was located at a different location.   The limitations of evaluation and management by telemedicine as well as the availability of in-person appointments were discussed. Patient was informed that she  may incur a bill ( including co-pay) for this virtual visit encounter. Anita Pratt  expressed understanding and gave verbal consent to proceed with virtual visit.  [id number] 06/13/19 Arrival Time: 1410  Cc: Medication refill  SUBJECTIVE:  Anita Pratt is a 48 y.o. female who presents for blood pressure medication refill.  Hx of HTN x "years."  States blood pressure on average is 135/90.  Takes HCTZ and is well controlled.  Does not have a PCP.  Denies HA, vision changes, dizziness, lightheadedness, chest pain, shortness of breath, numbness or tingling in extremities, abdominal pain, changes in bowel or bladder habits.     Also requests B12 injection refill.  Takes for vitamin B12 deficiency and energy.  Does not have a PCP.    ROS: As per HPI.  All other pertinent ROS negative.     Past Medical History:  Diagnosis Date  . Hypertension    Past Surgical History:  Procedure Laterality Date  . ABDOMINAL HYSTERECTOMY    . HERNIA REPAIR     Allergies  Allergen Reactions  . Erythromycin Nausea And Vomiting  . Lisinopril Other (See Comments)    Severe stomach pain  . Tape Rash   No current facility-administered medications on file prior to encounter.    Current Outpatient Medications on File  Prior to Encounter  Medication Sig Dispense Refill  . amLODipine (NORVASC) 10 MG tablet Take 10 mg by mouth daily.    . fluticasone (FLONASE) 50 MCG/ACT nasal spray Place 2 sprays into both nostrils daily. 16 g 0  . meloxicam (MOBIC) 15 MG tablet Take 15 mg by mouth daily.    . traMADol (ULTRAM) 50 MG tablet TK 1 T PO Q 8 H        OBJECTIVE:  There were no vitals filed for this visit.   Video unavailable  General: alert; no distress HENT: no slurred speech; tolerating own secretions without difficulty Lungs: normal respiratory effort; speaking in full sentences without difficulty Neurologic: No slurred speech Psychological: alert and cooperative; normal mood and affect   ASSESSMENT & PLAN:  1. Essential hypertension   2. Medication refill   3. Vitamin B 12 deficiency     Meds ordered this encounter  Medications  . hydrochlorothiazide (MICROZIDE) 12.5 MG capsule    Sig: Take 1 capsule (12.5 mg total) by mouth daily.    Dispense:  90 capsule    Refill:  0    Order Specific Question:   Supervising Provider    Answer:   Raylene Everts [[id number]]  . cyanocobalamin (,VITAMIN B-12,) 1000 MCG/ML injection    Sig: INJECT 1 ML IN THE MUSCLE MONTHLY    Dispense:  2 mL    Refill:  1    Order Specific Question:   Supervising Provider    Answer:  Rica Mast SUE [[id number]]    Blood pressure medication refilled.  Vitamin B-12 injection refilled. Take as directed.   Please continue to monitor blood pressure at home and keep a log Eat a well balanced diet of fruits, vegetables and lean meats.  Avoid foods high in fat and salt Drink water.  At least half your body weight in ounces Exercise for at least 30 minutes daily Follow up with Dr. Sheryle Hail to establish care and for further evaluation and management of high blood pressure Follow up in person or go to the ED if you have any new or worsening symptoms such as vision changes, fatigue, dizziness, chest pain, shortness of  breath, nausea, swelling in your hands or feet, urinary symptoms, etc...  I discussed the assessment and treatment plan with the patient. The patient was provided an opportunity to ask questions and all were answered. The patient agreed with the plan and demonstrated an understanding of the instructions.   The patient was advised to call back or seek an in-person evaluation if the symptoms worsen or if the condition fails to improve as anticipated.  I provided 6 minutes of non-face-to-face time during this encounter.  [place], PA-C  06/13/2019 2:24 PM           Rennis Harding, PA-C 06/13/19 1424

## 2019-06-13 NOTE — Discharge Instructions (Signed)
Blood pressure medication refilled.  Vitamin B-12 injection refilled. Take as directed.   Please continue to monitor blood pressure at home and keep a log Eat a well balanced diet of fruits, vegetables and lean meats.  Avoid foods high in fat and salt Drink water.  At least half your body weight in ounces Exercise for at least 30 minutes daily Follow up with Dr. Marcille Blanco to establish care and for further evaluation and management of high blood pressure Follow up in person or go to the ED if you have any new or worsening symptoms such as vision changes, fatigue, dizziness, chest pain, shortness of breath, nausea, swelling in your hands or feet, urinary symptoms, etc..Marland Kitchen

## 2020-01-12 ENCOUNTER — Other Ambulatory Visit: Payer: Self-pay

## 2020-01-12 ENCOUNTER — Ambulatory Visit
Admission: RE | Admit: 2020-01-12 | Discharge: 2020-01-12 | Disposition: A | Discharge status: Home / Self Care | Payer: BLUE CROSS/BLUE SHIELD | Source: Ambulatory Visit | Attending: Internal Medicine | Admitting: Internal Medicine

## 2020-01-12 ENCOUNTER — Ambulatory Visit (INDEPENDENT_AMBULATORY_CARE_PROVIDER_SITE_OTHER): Payer: BLUE CROSS/BLUE SHIELD

## 2020-01-12 VITALS — BP 154/94 | HR 92 | Temp 98.1°F | Resp 14 | Ht 64.0 in | Wt 110.0 lb

## 2020-01-12 DIAGNOSIS — S20212A Contusion of left front wall of thorax, initial encounter: Secondary | ICD-10-CM

## 2020-01-12 NOTE — ED Provider Notes (Signed)
[facility]    CSN: [id number] Arrival date & time: 01/12/20  0903      History   Chief Complaint Chief Complaint  Patient presents with  . APPOINTMENT  . Rib Pain    left    HPI Anita Pratt is a 49 y.o. female. who presents with L lateral rib pain since 6/24 at which time as she scooted a suit case from the back of her car while it was an in upward incline, the suit case hit her rib. She has been on steroid during this time and feels this helped, but when she got finished noticed her pain is not improving. Denies hx of osteoporosis. She felt a pop on her rib when the injury happened. Deneis SOB, but taking deep breaths provoked her pain. She was vacationing in [place] and still was able to go hiking. She has a lump on the border of her L anterior rib that is tender.   Pain level right now 6/10. Has taken tramadol twice yesterday prescribed by ortho.    Past Medical History:  Diagnosis Date  . Hypertension     There are no problems to display for this patient.   Past Surgical History:  Procedure Laterality Date  . ABDOMINAL HYSTERECTOMY    . CERVICAL SPINE SURGERY    . HERNIA REPAIR      OB History   No obstetric history on file.      Home Medications    Prior to Admission medications   Medication Sig Start Date End Date Taking? Authorizing Provider  amLODipine (NORVASC) 10 MG tablet Take 10 mg by mouth daily.   Yes [provider]  cyanocobalamin (,VITAMIN B-12,) 1000 MCG/ML injection INJECT 1 ML IN THE MUSCLE MONTHLY 06/13/19  Yes Wurst, [place], PA-C  fluticasone (FLONASE) 50 MCG/ACT nasal spray Place 2 sprays into both nostrils daily. 07/27/18  Yes Cook, Jayce G, DO  hydrochlorothiazide (MICROZIDE) 12.5 MG capsule Take 1 capsule (12.5 mg total) by mouth daily. 06/13/19  Yes Wurst, [place], PA-C  meloxicam (MOBIC) 15 MG tablet Take 15 mg by mouth daily.   Yes [provider]  traMADol (ULTRAM) 50 MG tablet TK 1 T PO Q 8 H 11/15/18   Yes [provider]    Family History Family History  Problem Relation Age of Onset  . Healthy Mother   . Alzheimer's disease Father     Social History Social History   Tobacco Use  . Smoking status: Never Smoker  . Smokeless tobacco: Never Used  Vaping Use  . Vaping Use: Never used  Substance Use Topics  . Alcohol use: Yes    Comment: social  . Drug use: No     Allergies   Erythromycin, Lisinopril, and Tape   Review of Systems Review of Systems  Constitutional: Negative for activity change and fever.  Respiratory: Negative for cough, chest tightness and shortness of breath.   Gastrointestinal: Negative for abdominal pain.  Musculoskeletal: Negative for back pain.  Skin: Negative for rash and wound.       Does not have ecchymosis  Neurological: Negative for headaches.  Hematological: Negative for adenopathy.     Physical Exam Triage Vital Signs ED Triage Vitals  Enc Vitals Group     BP 01/12/20 0915 (!) 154/94     Pulse Rate 01/12/20 0915 92     Resp 01/12/20 0915 14     Temp 01/12/20 0915 98.1 F (36.7 C)     Temp Source  01/12/20 0915 Oral     SpO2 01/12/20 0915 97 %     Weight 01/12/20 0912 110 lb (49.9 kg)     Height 01/12/20 0912 5\' 4"  (1.626 m)     Head Circumference --      Peak Flow --      Pain Score 01/12/20 0912 6     Pain Loc --      Pain Edu? --      Excl. in GC? --    No data found.  Updated Vital Signs BP (!) 154/94 (BP Location: Left Arm)   Pulse 92   Temp 98.1 F (36.7 C) (Oral)   Resp 14   Ht 5\' 4"  (1.626 m)   Wt 110 lb (49.9 kg)   SpO2 97%   BMI 18.88 kg/m   Visual Acuity Right Eye Distance:   Left Eye Distance:   Bilateral Distance:    Right Eye Near:   Left Eye Near:    Bilateral Near:     Physical Exam Vitals and nursing note reviewed.  Constitutional:      General: She is not in acute distress.    Appearance: She is normal weight. She is not toxic-appearing.  HENT:     Head: Atraumatic.      Right Ear: External ear normal.     Left Ear: External ear normal.     Nose: Nose normal.  Eyes:     General: No scleral icterus. Cardiovascular:     Rate and Rhythm: Normal rate and regular rhythm.     Heart sounds: No murmur heard.   Pulmonary:     Effort: Pulmonary effort is normal.     Breath sounds: Normal breath sounds.     Comments: RIBS- L lateral area looks a little swollen, she has tenderness on the L anterior and lateral last 3 ribs. On the anterior portion and a quarter size crepitous mass that may be coming from the tip of the floater rib. This is very tender.  Abdominal:     General: Abdomen is flat. Bowel sounds are normal. There is no distension.     Palpations: Abdomen is soft. There is no mass.     Tenderness: There is no abdominal tenderness. There is no guarding or rebound.  Musculoskeletal:        General: Normal range of motion.     Cervical back: Neck supple.  Skin:    General: Skin is warm and dry.     Findings: No bruising, lesion or rash.  Neurological:     Mental Status: She is alert and oriented to person, place, and time.     Gait: Gait normal.  Psychiatric:        Mood and Affect: Mood normal.        Behavior: Behavior normal.        Thought Content: Thought content normal.        Judgment: Judgment normal.      UC Treatments / Results  Labs (all labs ordered are listed, but only abnormal results are displayed) Labs Reviewed - No data to display  EKG   Radiology No results found.  Procedures Procedures (including critical care time)  Medications Ordered in UC Medications - No data to display  Initial Impression / Assessment and Plan / UC Course  I have reviewed the triage vital signs and the nursing notes. Pertinent imaging was reviewed with pt.   Final Clinical Impressions(s) / UC Diagnoses   Final diagnoses:  None   Discharge Instructions   None    ED Prescriptions    None     PDMP not reviewed this encounter.    Garey Ham, PA-C 01/12/20 1051

## 2020-01-12 NOTE — Discharge Instructions (Signed)
Rib bruising may take 6-8 weeks to fully resolve. Please follow up with your orthopedist to have that lump on your ribs rechecked which per the radiology report, does not show to be a fractured rib.

## 2020-01-12 NOTE — ED Triage Notes (Signed)
Patient c/o left sided rib pain that started on June 24th.  Patient denies falling.  Patient states that she moving her car on and incline and felt pain on the left side of her rib cage.

## 2020-03-29 ENCOUNTER — Other Ambulatory Visit: Payer: Self-pay

## 2020-03-29 ENCOUNTER — Ambulatory Visit
Admission: RE | Admit: 2020-03-29 | Discharge: 2020-03-29 | Disposition: A | Discharge status: Home / Self Care | Payer: BLUE CROSS/BLUE SHIELD | Source: Ambulatory Visit | Attending: Physician Assistant | Admitting: Physician Assistant

## 2020-03-29 VITALS — BP 143/90 | HR 86 | Temp 98.3°F | Resp 14 | Ht 64.0 in | Wt 110.0 lb

## 2020-03-29 DIAGNOSIS — H1033 Unspecified acute conjunctivitis, bilateral: Secondary | ICD-10-CM | POA: Diagnosis not present

## 2020-03-29 MED ORDER — TOBRAMYCIN-DEXAMETHASONE 0.3-0.1 % OP SUSP: 2.0000 [drp] | OPHTHALMIC | 0 refills | Status: AC

## 2020-03-29 MED ORDER — NAPHAZOLINE-PHENIRAMINE 0.025-0.3 % OP SOLN: 1.0000 [drp] | OPHTHALMIC | 0 refills | Status: AC | PRN

## 2020-03-29 NOTE — ED Triage Notes (Signed)
Patient states that she has been having redness in both eyes for the past couple of days.  Patient states that on Monday she accidentally touched her eyedrop bottle tip to her left eye on Monday.  Patient states that she is not sure if she might have scratched her left eye.  Patient reports sensitivity to light.

## 2020-03-29 NOTE — ED Provider Notes (Signed)
[facility]    CSN: [id number] Arrival date & time: 03/29/20  1354      History   Chief Complaint Chief Complaint  Patient presents with  . Eye Problem    HPI Anita Pratt is a 49 y.o. female.   Patient presents for redness of both eyes for the past couple of days.  She says they are painful, sensitive to light and itchy.  She states that she saw Dr. Adriana Simas for something similar in the past and he gave her TobraDex which really helped.  She says she used the last of it a couple days ago and it seemed to help again.  She would like a refill if possible.  She denies any fever, severe headaches, colored drainage from the eyes.  She does have a history of allergies as well and uses the nasal spray.  She admits to sneezing.  She denies any cough or congestion.  Denies any fevers or sore throat.  No breathing difficulty.  No concern for Covid.  No other concerns today.  HPI  Past Medical History:  Diagnosis Date  . Hypertension     There are no problems to display for this patient.   Past Surgical History:  Procedure Laterality Date  . ABDOMINAL HYSTERECTOMY    . CERVICAL SPINE SURGERY    . HERNIA REPAIR      OB History   No obstetric history on file.      Home Medications    Prior to Admission medications   Medication Sig Start Date End Date Taking? Authorizing Provider  cyanocobalamin (,VITAMIN B-12,) 1000 MCG/ML injection INJECT 1 ML IN THE MUSCLE MONTHLY 06/13/19  Yes Wurst, [place], PA-C  fluticasone (FLONASE) 50 MCG/ACT nasal spray Place 2 sprays into both nostrils daily. 07/27/18  Yes Cook, Jayce G, DO  hydrochlorothiazide (MICROZIDE) 12.5 MG capsule Take 1 capsule (12.5 mg total) by mouth daily. 06/13/19  Yes Wurst, [place], PA-C  meloxicam (MOBIC) 15 MG tablet Take 15 mg by mouth daily.   Yes [provider]  traMADol (ULTRAM) 50 MG tablet TK 1 T PO Q 8 H 11/15/18  Yes [provider]  amLODipine (NORVASC) 10 MG tablet Take 10 mg by  mouth daily.    [provider]  naphazoline-pheniramine (NAPHCON-A) 0.025-0.3 % ophthalmic solution Place 1 drop into both eyes every 4 (four) hours as needed for up to 7 days for eye irritation. 03/29/20 04/05/20  Eusebio Friendly B, PA-C  tobramycin-dexamethasone [facility]) ophthalmic solution Place 2 drops into both eyes every 4 (four) hours while awake for 7 days. 03/29/20 04/05/20  Shirlee Latch, PA-C    Family History Family History  Problem Relation Age of Onset  . Healthy Mother   . Alzheimer's disease Father     Social History Social History   Tobacco Use  . Smoking status: Never Smoker  . Smokeless tobacco: Never Used  Vaping Use  . Vaping Use: Never used  Substance Use Topics  . Alcohol use: Yes    Comment: social  . Drug use: No     Allergies   Erythromycin, Lisinopril, and Tape   Review of Systems Review of Systems  Constitutional: Negative for fever.  HENT: Positive for sneezing. Negative for ear pain and sore throat.   Eyes: Positive for pain, discharge and redness. Negative for photophobia and itching.  Respiratory: Negative for cough.   Gastrointestinal: Negative for nausea and vomiting.  Neurological: Negative for headaches.     Physical Exam  Triage Vital Signs ED Triage Vitals  Enc Vitals Group     BP 03/29/20 1425 (!) 143/90     Pulse Rate 03/29/20 1425 86     Resp 03/29/20 1425 14     Temp 03/29/20 1425 98.3 F (36.8 C)     Temp Source 03/29/20 1425 Oral     SpO2 03/29/20 1425 97 %     Weight 03/29/20 1421 110 lb (49.9 kg)     Height 03/29/20 1421 5\' 4"  (1.626 m)     Head Circumference --      Peak Flow --      Pain Score 03/29/20 1421 7     Pain Loc --      Pain Edu? --      Excl. in GC? --    No data found.  Updated Vital Signs BP (!) 143/90 (BP Location: Left Arm)   Pulse 86   Temp 98.3 F (36.8 C) (Oral)   Resp 14   Ht 5\' 4"  (1.626 m)   Wt 110 lb (49.9 kg)   SpO2 97%   BMI 18.88 kg/m   Visual Acuity Right Eye  Distance: 20/25 uncorrected Left Eye Distance: 20/25 uncorrected Bilateral Distance: 20/20 uncorrected  Right Eye Near:   Left Eye Near:    Bilateral Near:     Physical Exam Vitals and nursing note reviewed.  Constitutional:      General: She is not in acute distress.    Appearance: Normal appearance. She is not ill-appearing or toxic-appearing.  HENT:     Head: Normocephalic and atraumatic.     Nose: Nose normal.     Mouth/Throat:     Mouth: Mucous membranes are moist.     Pharynx: Oropharynx is clear.  Eyes:     General: No scleral icterus.    Extraocular Movements: Extraocular movements intact.     Conjunctiva/sclera:     Right eye: Right conjunctiva is injected.     Left eye: Left conjunctiva is injected.     Pupils: Pupils are equal, round, and reactive to light.     Comments: Watery drainage bilaterally.  Photophobia both eyes.  Cardiovascular:     Rate and Rhythm: Normal rate and regular rhythm.     Heart sounds: Normal heart sounds.  Pulmonary:     Effort: Pulmonary effort is normal. No respiratory distress.     Breath sounds: Normal breath sounds.  Musculoskeletal:     Cervical back: Neck supple.  Skin:    General: Skin is dry.  Neurological:     General: No focal deficit present.     Mental Status: She is alert. Mental status is at baseline.     Motor: No weakness.     Gait: Gait normal.  Psychiatric:        Mood and Affect: Mood normal.        Behavior: Behavior normal.        Thought Content: Thought content normal.      UC Treatments / Results  Labs (all labs ordered are listed, but only abnormal results are displayed) Labs Reviewed - No data to display  EKG   Radiology No results found.  Procedures Procedures (including critical care time)  Medications Ordered in UC Medications - No data to display  Initial Impression / Assessment and Plan / UC Course  I have reviewed the triage vital signs and the nursing notes.  Pertinent labs &  imaging results that were available during my care of  the patient were reviewed by me and considered in my medical decision making (see chart for details).   Advised patient her symptoms are most consistent with allergic conjunctivitis.  She insists that she has relief with TobraDex.  Advised her that I sent Naphcon-A to the pharmacy and she should try that first.  She does not have any relief with this medication and Claritin, she should start the TobraDex and follow-up as needed.  Final Clinical Impressions(s) / UC Diagnoses   Final diagnoses:  Acute conjunctivitis of both eyes, unspecified acute conjunctivitis type   Discharge Instructions   None    ED Prescriptions    Medication Sig Dispense Auth. Provider   tobramycin-dexamethasone [facility]) ophthalmic solution Place 2 drops into both eyes every 4 (four) hours while awake for 7 days. 5 mL Eusebio Friendly B, PA-C   naphazoline-pheniramine (NAPHCON-A) 0.025-0.3 % ophthalmic solution Place 1 drop into both eyes every 4 (four) hours as needed for up to 7 days for eye irritation. 15 mL Shirlee Latch, PA-C     PDMP not reviewed this encounter.   Shirlee Latch, PA-C 03/30/20 [phone]

## 2022-03-17 IMAGING — CR DG RIBS W/ CHEST 3+V*L*
5 series · 5 of 5 positions shown · non-contrast
Comparison: None.

CLINICAL DATA: Left rib injury and pain.

EXAM:
LEFT RIBS AND CHEST - 3+ VIEW

[chest pa]
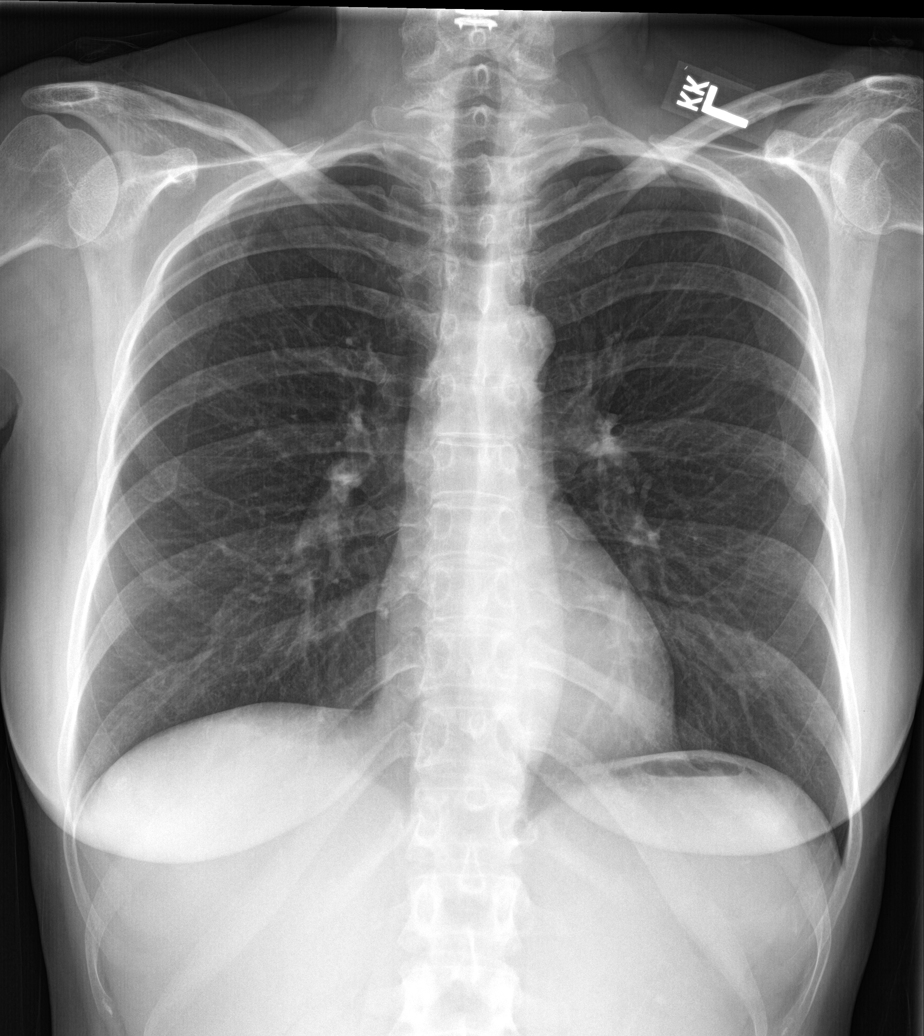

[rib pa (1 of 2)]
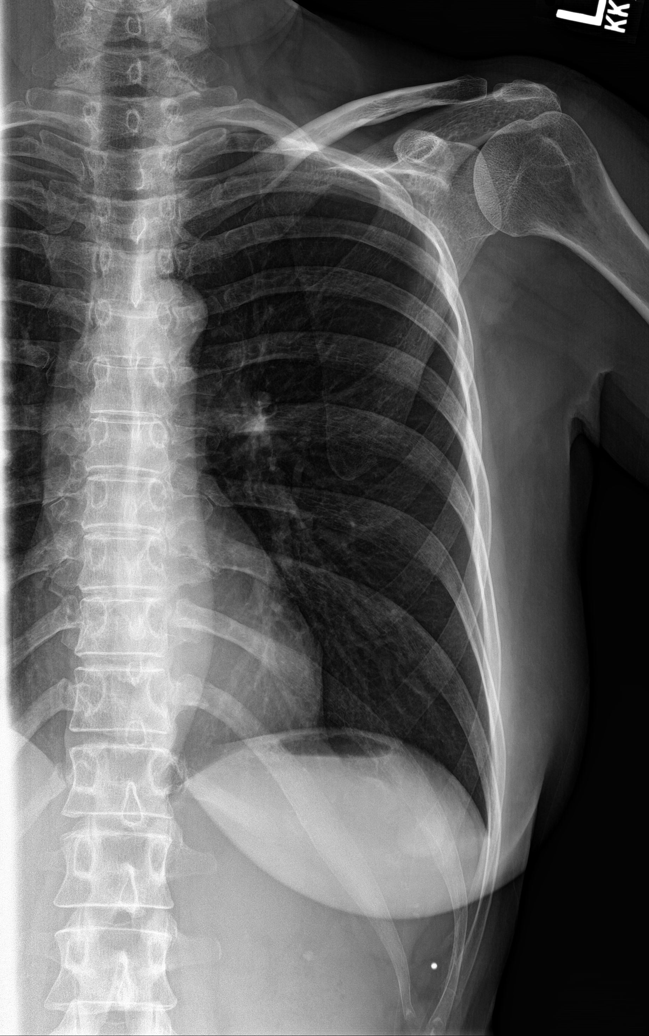

[rib pa (2 of 2)]
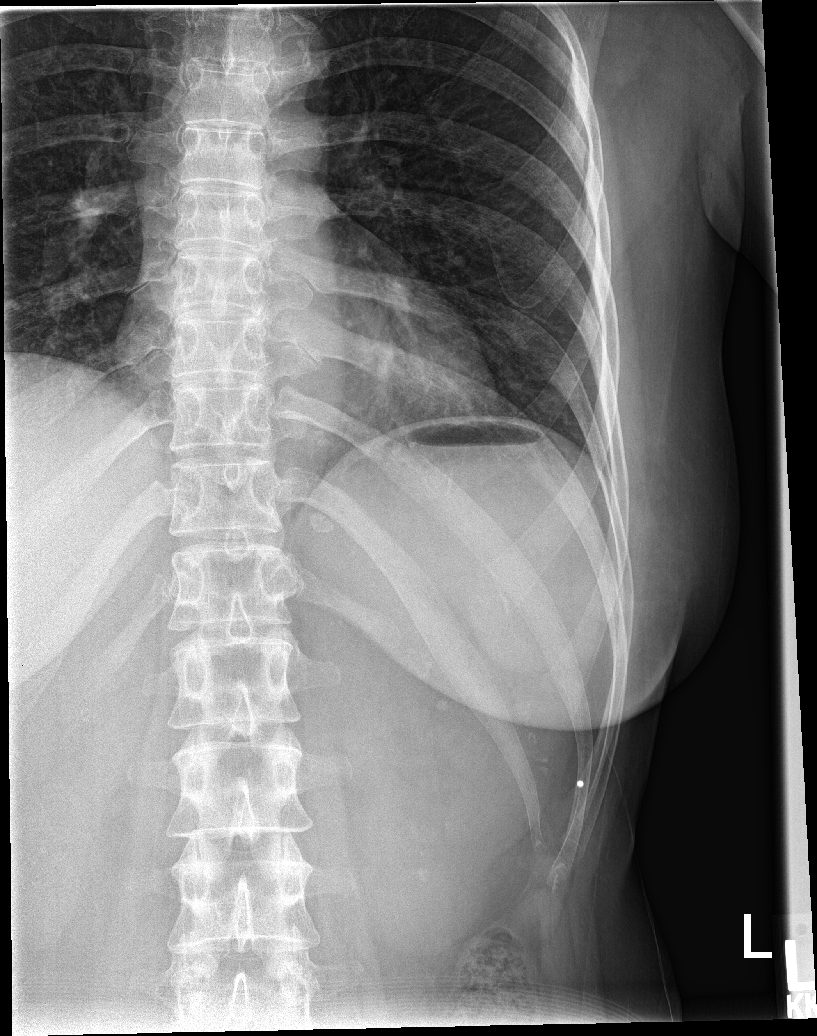

[rib obl (1 of 2)]
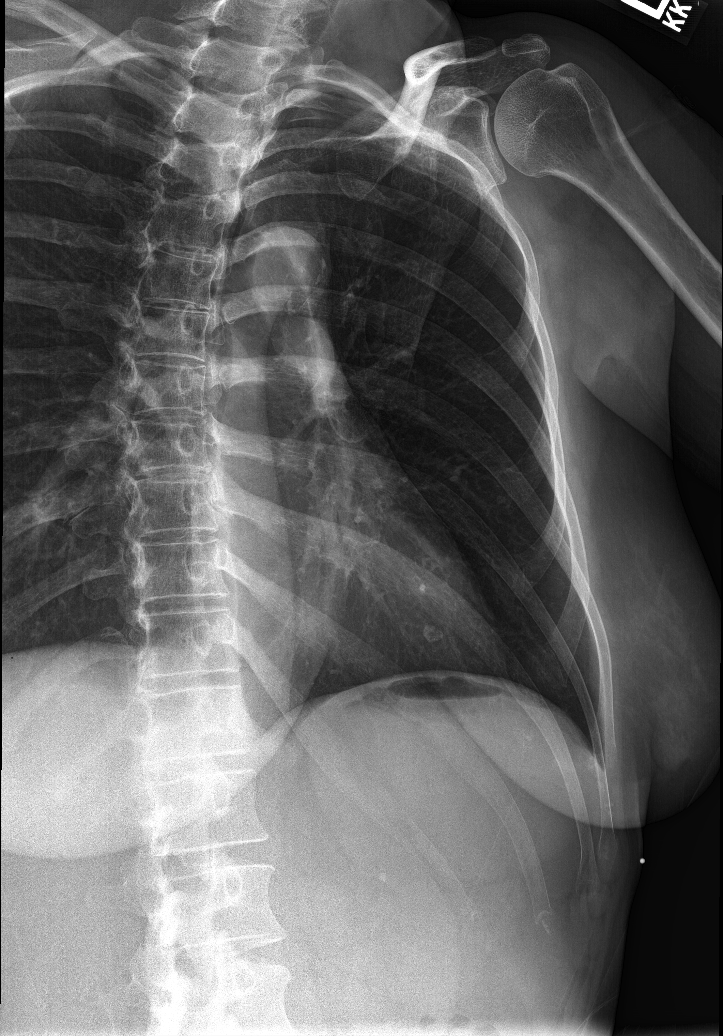

[rib obl (2 of 2)]
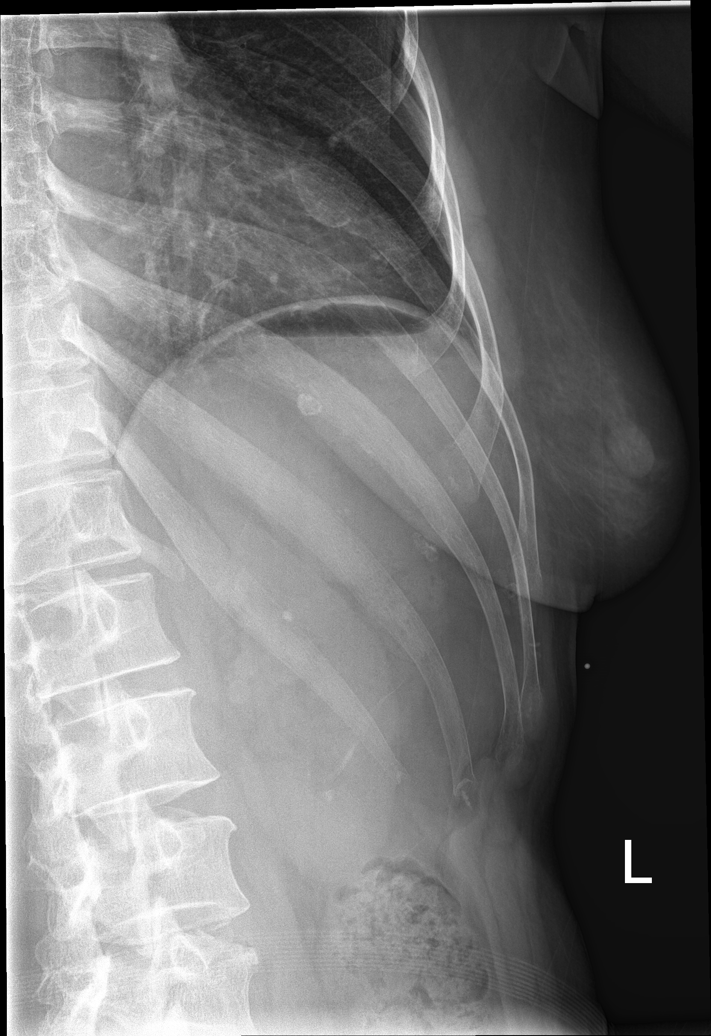

[5 of 5 positions shown; findings below may reference images not displayed]

FINDINGS: No fracture or other bone lesions are seen involving the ribs. There
is no evidence of pneumothorax or pleural effusion. Both lungs are
clear. Heart size and mediastinal contours are within normal limits.
IMPRESSION: Negative.
